# Patient Record
Sex: Female | Born: 1990 | Race: White | Hispanic: No | Marital: Single
Health system: Southern US, Community
[De-identification: ages and names within clinical notes are randomized; demographics above are authoritative.]

---

## 2017-06-06 ENCOUNTER — Emergency Department: Payer: Self-pay

## 2017-06-06 ENCOUNTER — Emergency Department
Admission: EM | Admit: 2017-06-06 | Discharge: 2017-06-06 | Disposition: A | Discharge status: Home / Self Care | Payer: Self-pay | Attending: Emergency Medicine | Admitting: Emergency Medicine

## 2017-06-06 ENCOUNTER — Encounter: Payer: Self-pay | Admitting: Emergency Medicine

## 2017-06-06 ENCOUNTER — Other Ambulatory Visit: Payer: Self-pay

## 2017-06-06 DIAGNOSIS — R109 Unspecified abdominal pain: Secondary | ICD-10-CM | POA: Insufficient documentation

## 2017-06-06 DIAGNOSIS — M25512 Pain in left shoulder: Secondary | ICD-10-CM | POA: Insufficient documentation

## 2017-06-06 LAB — URINALYSIS, COMPLETE (UACMP) WITH MICROSCOPIC
BILIRUBIN URINE: NEGATIVE
Glucose, UA: NEGATIVE mg/dL
HGB URINE DIPSTICK: NEGATIVE
Ketones, ur: NEGATIVE mg/dL
Nitrite: NEGATIVE
PROTEIN: NEGATIVE mg/dL
Specific Gravity, Urine: 1.01 (ref 1.005–1.030)
pH: 6 (ref 5.0–8.0)

## 2017-06-06 LAB — FIBRIN DERIVATIVES D-DIMER (ARMC ONLY): Fibrin derivatives D-dimer (ARMC): 517.32 ng/mL (FEU) — ABNORMAL HIGH (ref 0.00–499.00)

## 2017-06-06 LAB — CBC
HCT: 47.3 % — ABNORMAL HIGH (ref 35.0–47.0)
HEMOGLOBIN: 16.1 g/dL — AB (ref 12.0–16.0)
MCH: 32.2 pg (ref 26.0–34.0)
MCHC: 34 g/dL (ref 32.0–36.0)
MCV: 94.6 fL (ref 80.0–100.0)
Platelets: 202 10*3/uL (ref 150–440)
RBC: 5 MIL/uL (ref 3.80–5.20)
RDW: 12.8 % (ref 11.5–14.5)
WBC: 9.1 10*3/uL (ref 3.6–11.0)

## 2017-06-06 LAB — BASIC METABOLIC PANEL
Anion gap: 8 (ref 5–15)
BUN: 9 mg/dL (ref 6–20)
CHLORIDE: 104 mmol/L (ref 101–111)
CO2: 25 mmol/L (ref 22–32)
CREATININE: 0.77 mg/dL (ref 0.44–1.00)
Calcium: 9 mg/dL (ref 8.9–10.3)
GFR calc non Af Amer: >60 mL/min (ref >60)
GLUCOSE: 107 mg/dL — AB (ref 65–99)
Potassium: 4.1 mmol/L (ref 3.5–5.1)
Sodium: 137 mmol/L (ref 135–145)

## 2017-06-06 LAB — POCT PREGNANCY, URINE: Preg Test, Ur: NEGATIVE

## 2017-06-06 MED ORDER — CARISOPRODOL 350 MG PO TABS
350.0000 mg | ORAL_TABLET | Freq: Once | ORAL | Status: AC
  Administered 2017-06-06: 350 mg via ORAL

## 2017-06-06 MED ORDER — IBUPROFEN 400 MG PO TABS
ORAL_TABLET | ORAL | Status: AC
  Filled 2017-06-06: qty 1

## 2017-06-06 MED ORDER — IBUPROFEN 400 MG PO TABS
400.0000 mg | ORAL_TABLET | Freq: Once | ORAL | Status: AC | PRN
  Administered 2017-06-06: 400 mg via ORAL

## 2017-06-06 MED ORDER — CARISOPRODOL 350 MG PO TABS: 350.0000 mg | ORAL_TABLET | Freq: Three times a day (TID) | ORAL | 0 refills | Status: AC | PRN

## 2017-06-06 MED ORDER — IOPAMIDOL (ISOVUE-370) INJECTION 76%
75.0000 mL | Freq: Once | INTRAVENOUS | Status: AC | PRN
  Administered 2017-06-06: 75 mL via INTRAVENOUS
  Filled 2017-06-06: qty 75

## 2017-06-06 MED ORDER — SODIUM CHLORIDE 0.9 % IV BOLUS (SEPSIS)
1000.0000 mL | Freq: Once | INTRAVENOUS | Status: AC
  Administered 2017-06-06: 1000 mL via INTRAVENOUS

## 2017-06-06 NOTE — ED Provider Notes (Signed)
[facility] Emergency Department Provider Note  ____________________________________________   First MD Initiated Contact with Patient 06/06/17 1411     (approximate)  I have reviewed the triage vital signs and the nursing notes.   HISTORY  Chief Complaint Left side pain and Painful breathing   HPI Sydney Castro is a 27 y.o. female without any chronic medical conditions was presented to the emergency department with 1 day of left shoulder pain as well as left low back pain.  Says that the shoulder pain is sharp and a 6 out of 10 at this time it is worse with movement.  She says that it does not worsen the pain in her low back which is worse with deep breathing.  She is denying any injury.  Says that she has taken ibuprofen without relief.  No history of blood clots.  Denies any cough.  No nausea or vomiting.  No heavy lifting.  Denies sitting still for long period of time or using any new chairs.  Denies any history of kidney stones.  Says that she smokes about 4 cigarettes/day.  Denies any history of blood clots.  Denies the back pain being worse with movement.  Denies any vaginal bleeding or discharge.  Denies dysuria.   History reviewed. No pertinent past medical history.  There are no active problems to display for this patient.     Prior to Admission medications   Not on File    Allergies Patient has no known allergies.  No family history on file.  Social History Social History   Tobacco Use  . Smoking status: Not on file  Substance Use Topics  . Alcohol use: Not on file  . Drug use: Not on file    Review of Systems  Constitutional: No fever/chills Eyes: No visual changes. ENT: No sore throat. Cardiovascular: Denies chest pain. Respiratory: As above Gastrointestinal: No abdominal pain.  No nausea, no vomiting.  No diarrhea.  No constipation. Genitourinary: Negative for dysuria. Musculoskeletal: As above. Skin: Negative for  rash. Neurological: Negative for headaches, focal weakness or numbness.   ____________________________________________   PHYSICAL EXAM:  VITAL SIGNS: ED Triage Vitals  Enc Vitals Group     BP 06/06/17 1128 (!) 162/96     Pulse Rate 06/06/17 1128 (!) 107     Resp 06/06/17 1128 18     Temp 06/06/17 1128 98.8 F (37.1 C)     Temp Source 06/06/17 1128 Oral     SpO2 06/06/17 1128 97 %     Weight 06/06/17 1128 215 lb (97.5 kg)     Height 06/06/17 1128 5\' 3"  (1.6 m)     Head Circumference --      Peak Flow --      Pain Score 06/06/17 1127 7     Pain Loc --      Pain Edu? --      Excl. in GC? --     Constitutional: Alert and oriented. Well appearing and in no acute distress. Eyes: Conjunctivae are normal.  Head: Atraumatic. Nose: No congestion/rhinnorhea. Mouth/Throat: Mucous membranes are moist.  Neck: No stridor.   Cardiovascular: Normal rate, regular rhythm. Grossly normal heart sounds.  Respiratory: Normal respiratory effort.  No retractions. Lungs CTAB. Gastrointestinal: Soft and nontender. No distention. Musculoskeletal: No lower extremity tenderness nor edema.  No joint effusions.  Tenderness to palpation of the lateral clavicle at the [facility] joint without deformity.  Patient able to fully range her left upper extremity without issue.  Also with mild tenderness to palpation to the left CVA region without any deformity or step-off. Neurologic:  Normal speech and language. No gross focal neurologic deficits are appreciated. Skin:  Skin is warm, dry and intact. No rash noted. Psychiatric: Mood and affect are normal. Speech and behavior are normal.  ____________________________________________   LABS (all labs ordered are listed, but only abnormal results are displayed)  Labs Reviewed  CBC - Abnormal; Notable for the following components:      Result Value   Hemoglobin 16.1 (*)    HCT 47.3 (*)    All other components within normal limits  BASIC METABOLIC PANEL - Abnormal;  Notable for the following components:   Glucose, Bld 107 (*)    All other components within normal limits  FIBRIN DERIVATIVES D-DIMER ([facility] ONLY)  URINALYSIS, COMPLETE (UACMP) WITH MICROSCOPIC  POC URINE PREG, ED   ____________________________________________  EKG  ED ECG REPORT I, Arelia Longestavid M Rodger Giangregorio, the attending physician, personally viewed and interpreted this ECG.   Date: 06/06/2017  EKG Time: 1140  Rate: 103  Rhythm: sinus tachycardia  Axis: Normal  Intervals:none  ST&T Change: No S1 every 3 T3 pattern.  There is an S in lead I but lacks a every 3 T3 associated.  No other signs of right heart strain.  ____________________________________________  RADIOLOGY  Mild bibasilar atelectasis. ____________________________________________   PROCEDURES  Procedure(s) performed:   Procedures  Critical Care performed:   ____________________________________________   INITIAL IMPRESSION / ASSESSMENT AND PLAN / ED COURSE  Pertinent labs & imaging results that were available during my care of the patient were reviewed by me and considered in my medical decision making (see chart for details).  Differential diagnosis includes, but is not limited to, ACS, aortic dissection, pulmonary embolism, cardiac tamponade, pneumothorax, pneumonia, pericarditis, myocarditis, GI-related causes including esophagitis/gastritis, and musculoskeletal chest wall pain.   As part of my medical decision making, I reviewed the following data within the electronic MEDICAL RECORD NUMBER Old chart reviewed  Patient pending urinalysis.  We will [facility].  We will also order d-dimer to rule out PE.  The patient's heart rate has normalized since triage.  ----------------------------------------- 5:12 PM on 06/06/2017 -----------------------------------------  Patient at this time says the pain in her shoulders [facility] but still having left lower thoracic as  well as upper lumbar pain on the left.  D-dimer was elevated and we proceeded with CAT scan of the chest with contrast which did not reveal a PE but did show atelectasis to the bilateral lung fields.  Patient says that she will try icy hot to the low back area and she will be discharged with Soma.  Likely musculoskeletal pain in origin.  Reassuring workup otherwise.  Trace leukocytes on the urinalysis but without any blood.  Unlikely to be kidney stone.  Patient without any burning with urination or frequency.  Some squamous epithelial cells present in the urine and this is likely indicative of contamination without UTI.  Likely musculoskeletal pain.     ____________________________________________   FINAL CLINICAL IMPRESSION(S) / ED DIAGNOSES  Left shoulder pain.  Left flank pain.    NEW MEDICATIONS STARTED DURING THIS VISIT:  New Prescriptions   No medications on file     Note:  This document was prepared using Dragon voice recognition software and may include unintentional dictation errors.     Myrna BlazerSchaevitz, Miranda Garber Matthew, MD 06/06/17 [phone]

## 2017-06-06 NOTE — ED Notes (Signed)
Patient transported to CT 

## 2017-06-06 NOTE — ED Notes (Signed)
Blue top sent if needed 

## 2017-06-06 NOTE — ED Triage Notes (Signed)
Pt reports pain that began in left shoulder that has moved down her left side. Pt reports pain upon taking a breath. Pt reports has nexplonon. Denies any other symptoms. Pt reports pain began yesterday 1300 and has progressively gotten worse. Ambulatory to triage.

## 2019-08-17 IMAGING — CR DG CHEST 2V
1 series · 2 of 2 positions shown · non-contrast
Comparison: None.

CLINICAL DATA: Pt reports pain that began in left shoulder that has
moved down her left side. Pt reports pain upon taking a breath and
when laying down or sitting, Pt reports pain began yesterday 0266
and has progressively gotten worse, shielded

EXAM:
CHEST  2 VIEW

[Series 1: dg chest 2 view · 0.14mm/px · 2 of 2 slices shown]
[im 1/2]
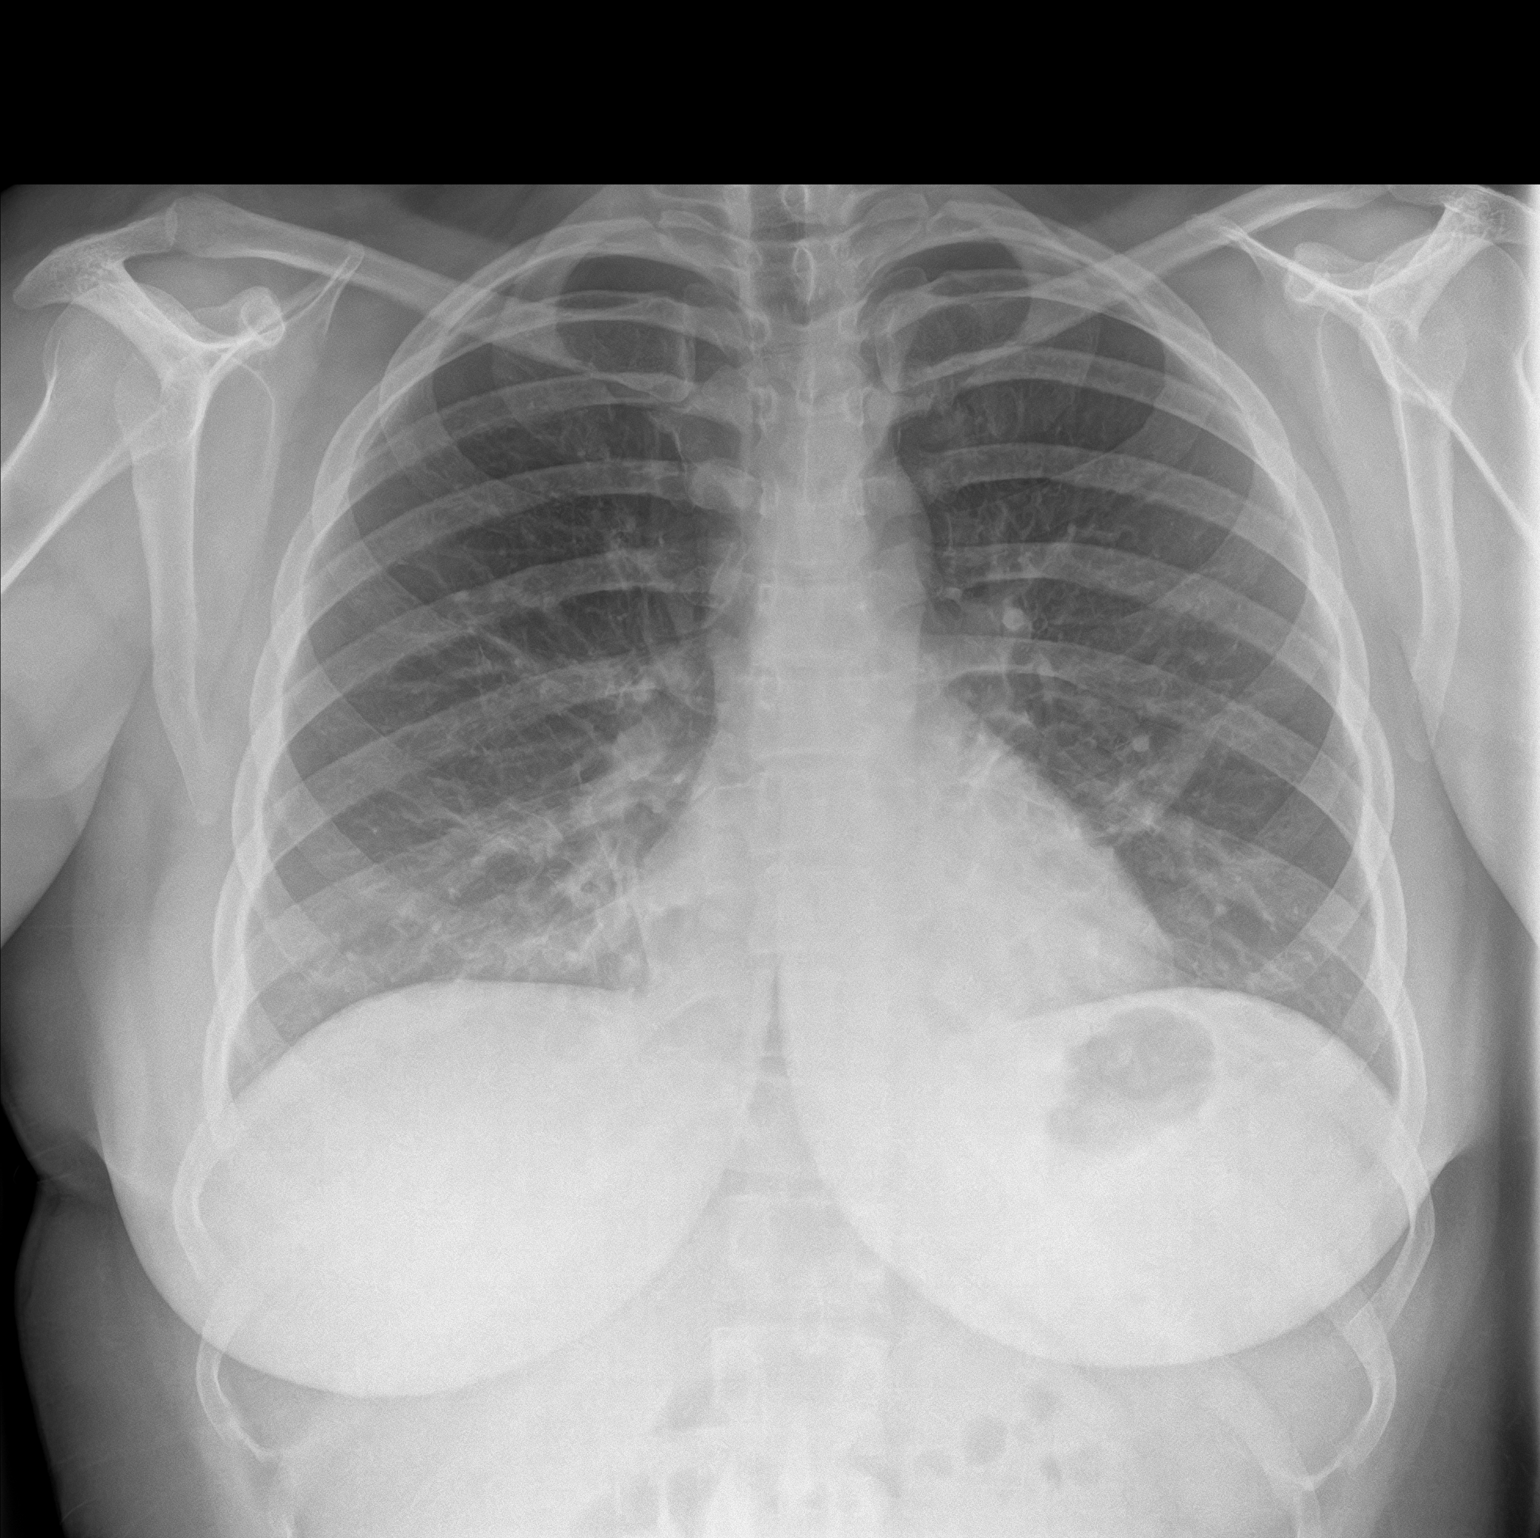
[im 2/2]
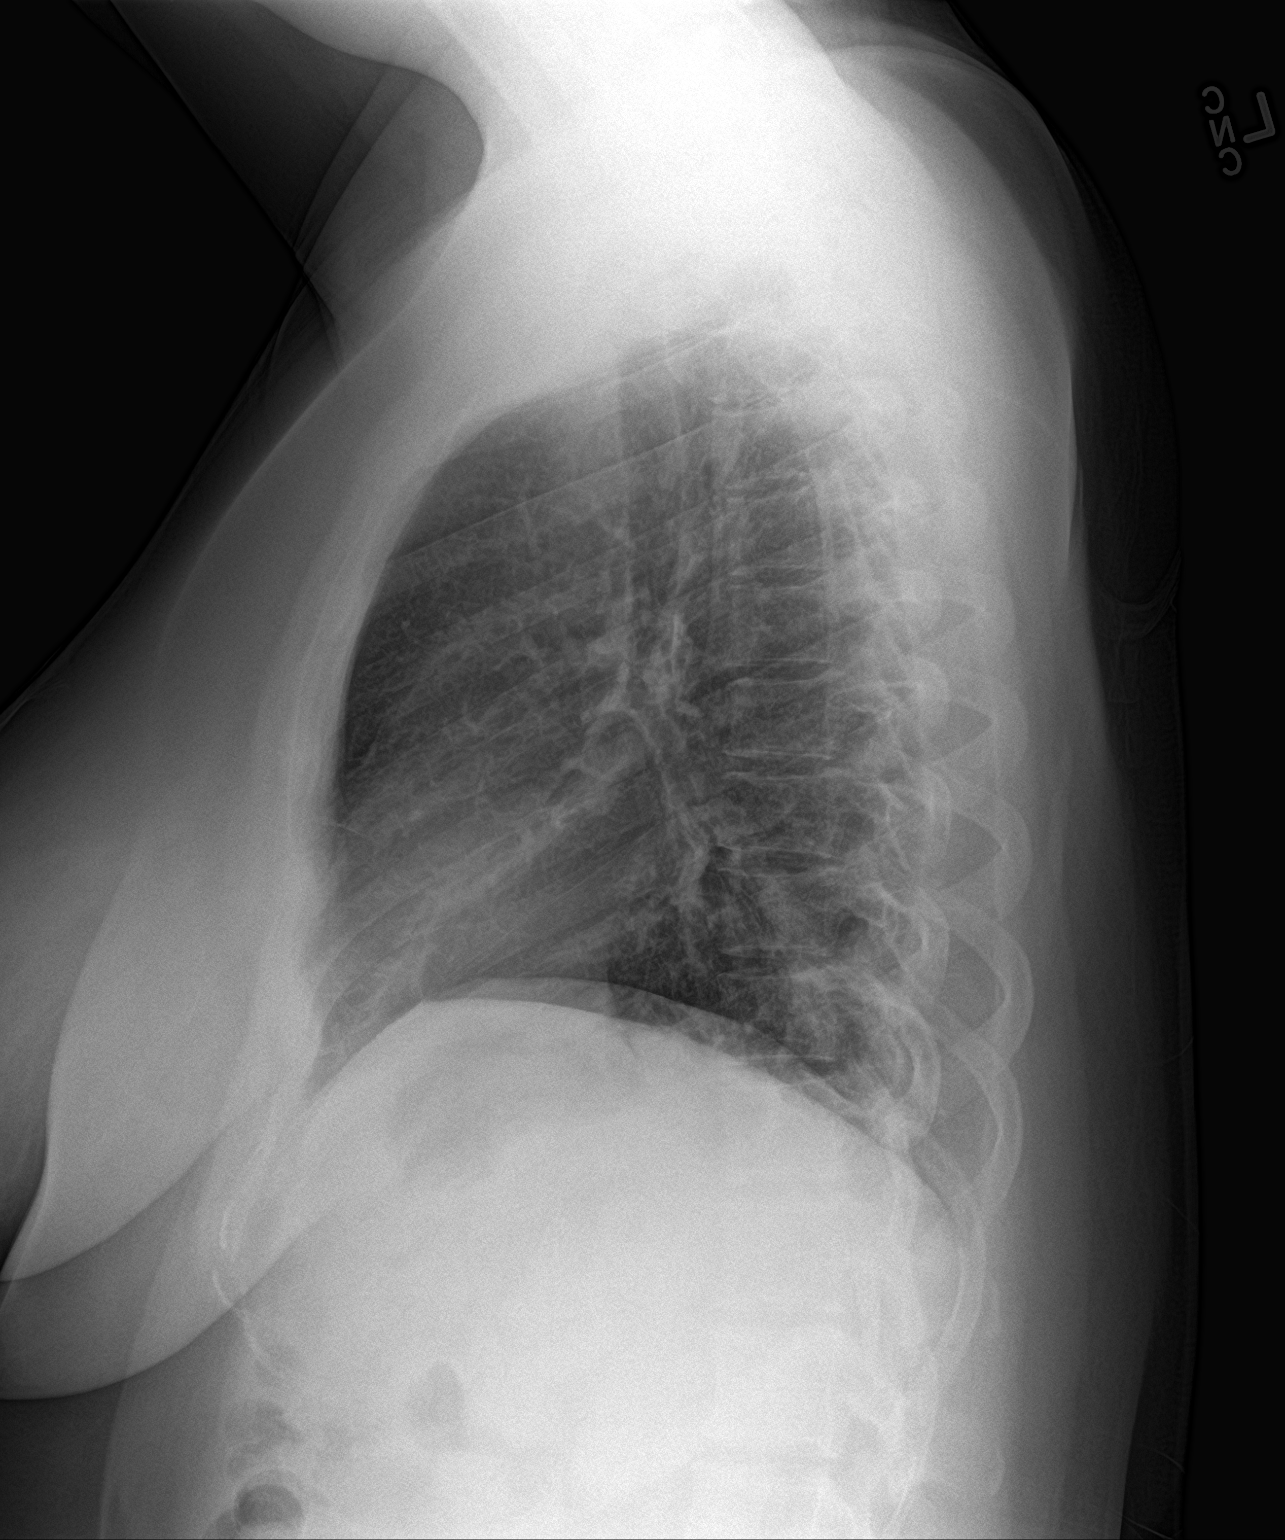

[2 of 2 positions shown; findings below may reference images not displayed]

FINDINGS: Heart size is normal. There are no focal consolidations or pleural
effusions. No pulmonary edema. There is minimal bibasilar
atelectasis.
IMPRESSION: Minimal bibasilar atelectasis.

## 2019-08-17 IMAGING — CT CT ANGIO CHEST
2 of 7 series · 18 of 36 positions shown · IV contrast (APPLIED)
Comparison: None

CLINICAL DATA: LEFT shoulder pain moving into LEFT side, pain with
inspiration, onset of pain at 9266 hours yesterday progressively
worsening, has Nexplanon, suspected pulmonary embolism

EXAM:
CT ANGIOGRAPHY CHEST WITH CONTRAST
TECHNIQUE: Multidetector CT imaging of the chest was performed using the
standard protocol during bolus administration of intravenous
contrast. Multiplanar CT image reconstructions and MIPs were
obtained to evaluate the vascular anatomy.
CONTRAST:  75mL [id number] IOPAMIDOL ([id number]) INJECTION 76% IV

[Series 5: thins · axial · 0.79mm/px · z∈[-514,-278]mm · 15 of 270 slices shown]
[im 17/270  lung]
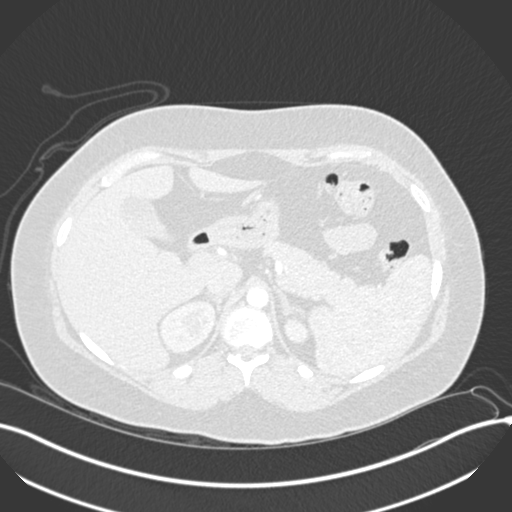
[im 34/270  mediastinal]
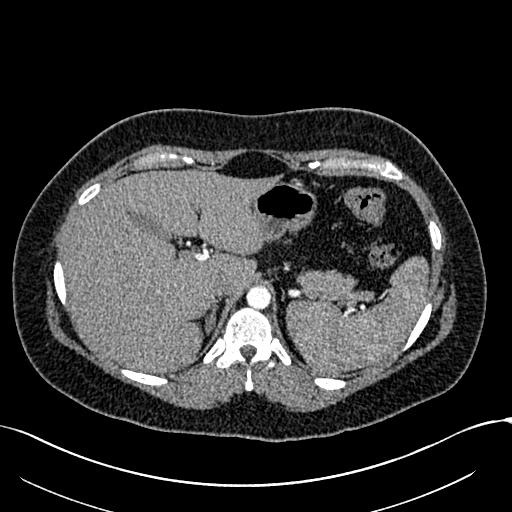
[im 51/270  lung]
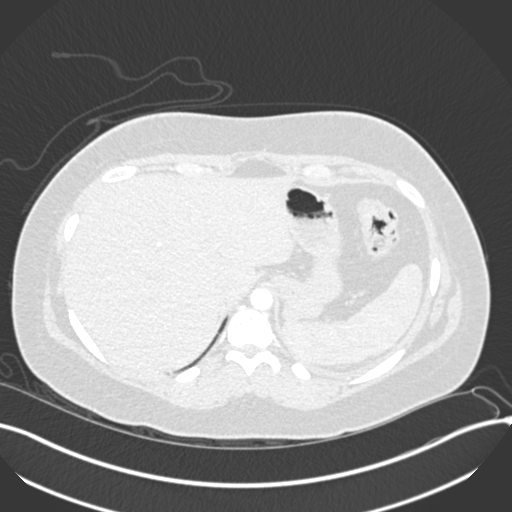
[im 68/270  mediastinal]
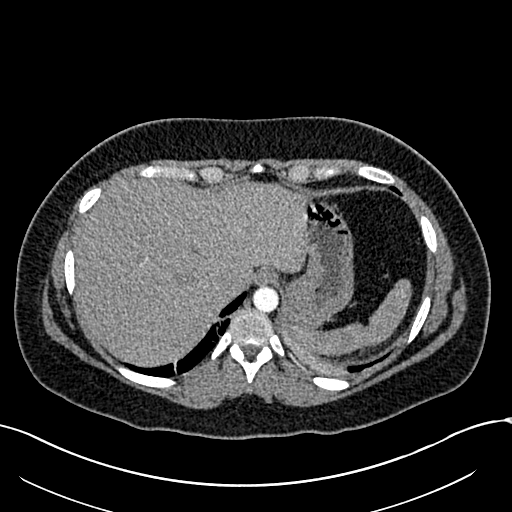
[im 85/270  lung]
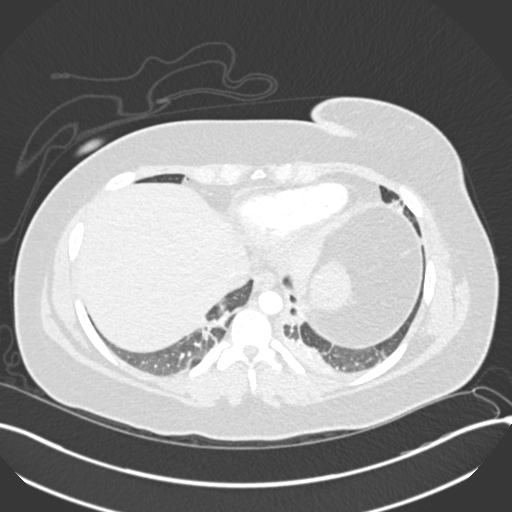
[im 101/270  mediastinal]
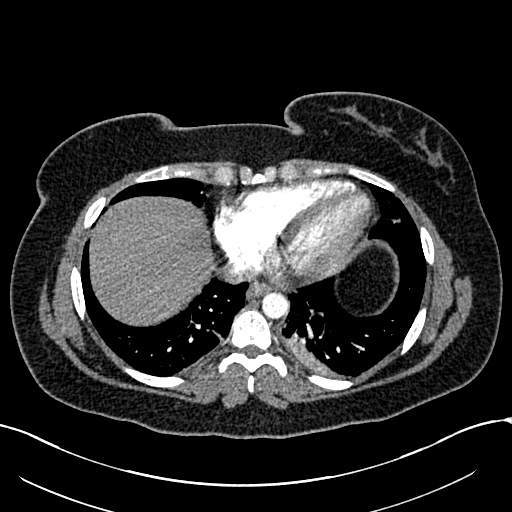
[im 118/270  lung]
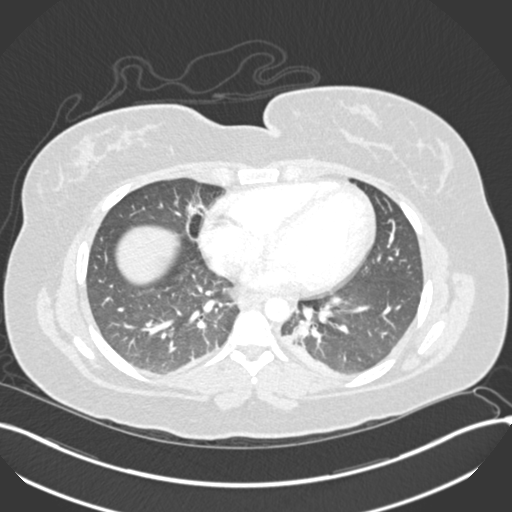
[im 135/270  mediastinal]
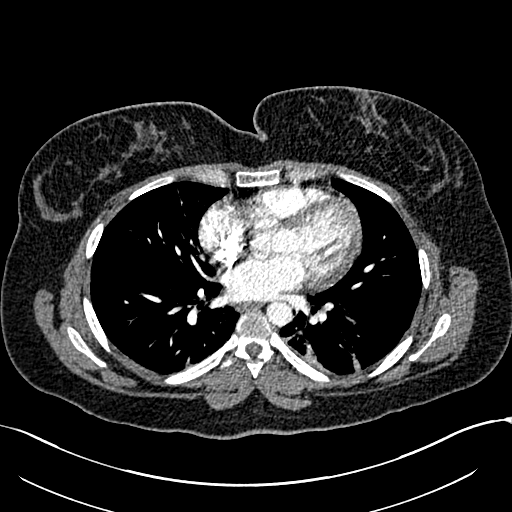
[im 152/270  lung]
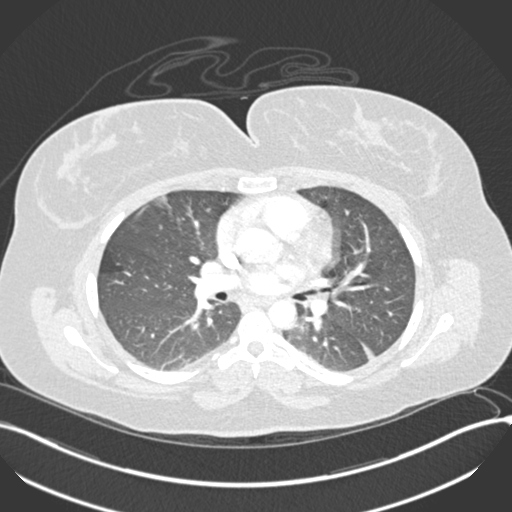
[im 169/270  mediastinal]
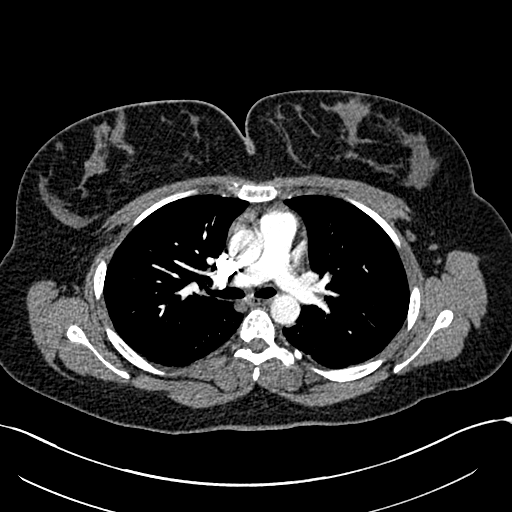
[im 185/270  lung]
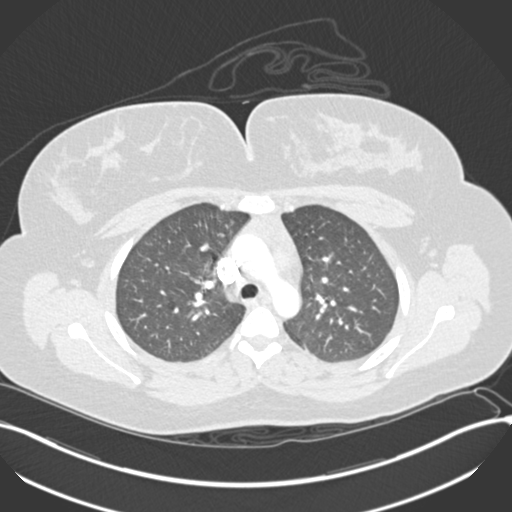
[im 202/270  mediastinal]
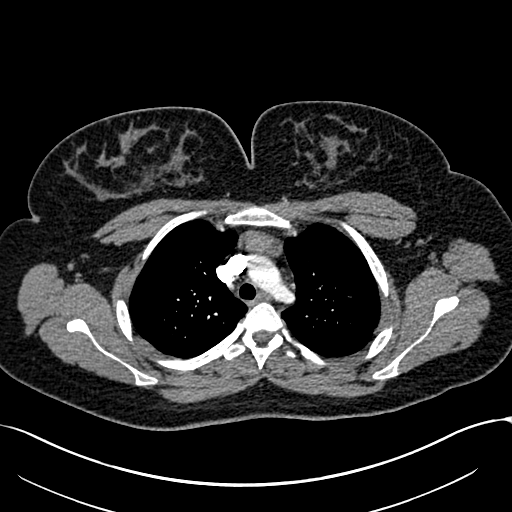
[im 219/270  lung]
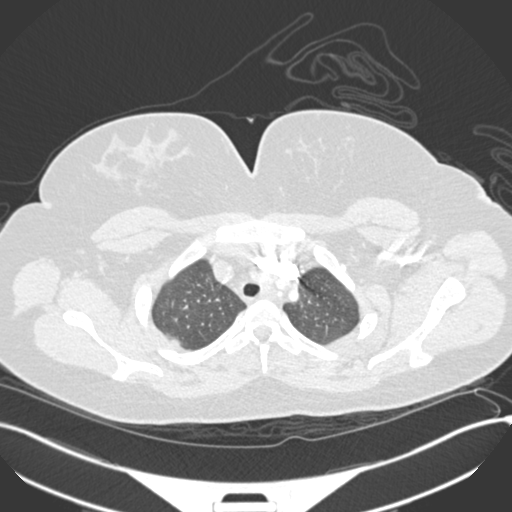
[im 236/270  mediastinal]
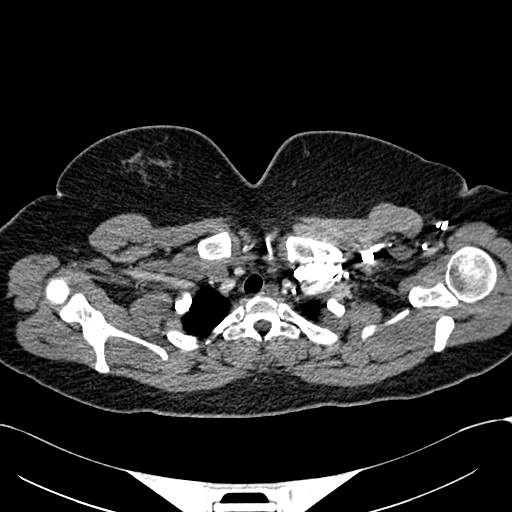
[im 253/270  lung]
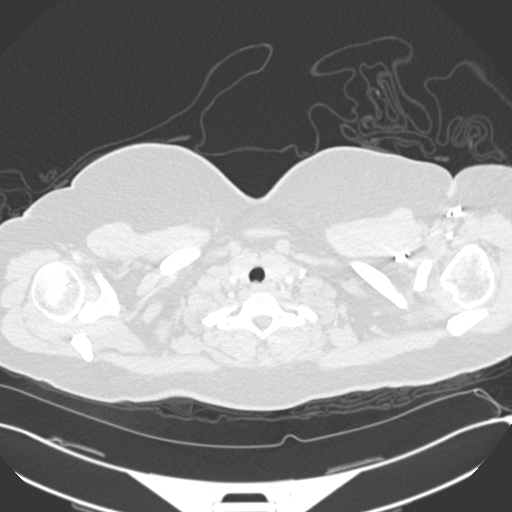

[Series 6: lung · axial · 0.79mm/px · z∈[-432,-318]mm · 3 of 76 slices shown]
[im 19/76  mediastinal]
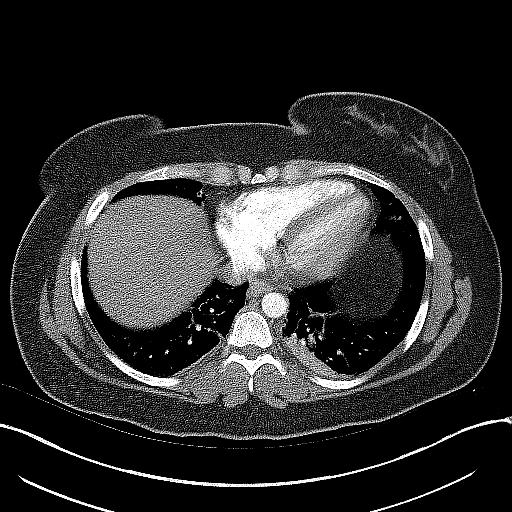
[im 38/76  mediastinal]
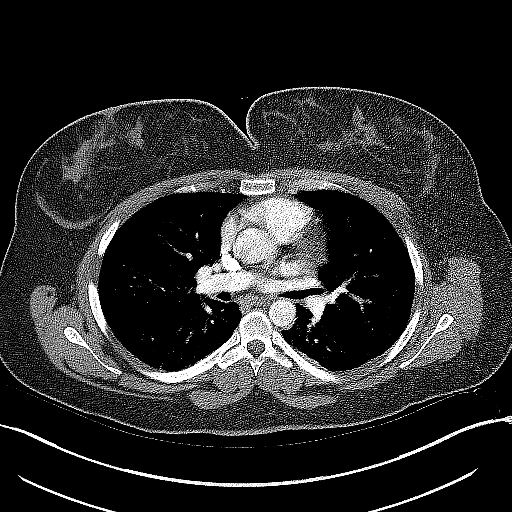
[im 57/76  mediastinal]
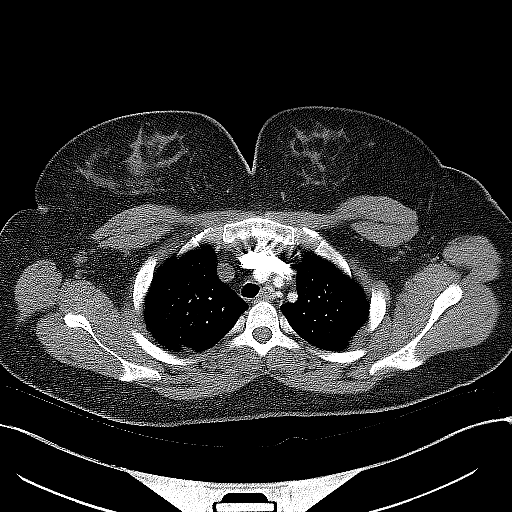

[18 of 36 positions shown; findings below may reference images not displayed]

FINDINGS: Cardiovascular: Aorta normal caliber without aneurysm or dissection.
Minimally prominent heart size. Pulmonary arteries adequately
opacified and patent. No definite evidence of pulmonary embolism. No
pericardial effusion.

Mediastinum/Nodes: Residual thymic tissue in anterior mediastinum.
No definite thoracic adenopathy. Esophagus unremarkable. Base of
cervical region normal appearance.

Lungs/Pleura: Subsegmental atelectasis BILATERAL lower lobes greater
on LEFT. Minimal subsegmental atelectasis at bases of lingula and
RIGHT middle lobe. No pulmonary infiltrate, pleural effusion or
pneumothorax.

Upper Abdomen: Visualized upper abdomen unremarkable

Musculoskeletal: No acute osseous findings.

Review of the MIP images confirms the above findings.
IMPRESSION: No evidence of pulmonary embolism.

Scattered atelectasis greatest in LEFT lower lobe.
